# Patient Record
Sex: Male | Born: 1995 | Race: Black or African American | Hispanic: No | Marital: Single | Smoking: Former smoker
Health system: Southern US, Community
[De-identification: ages and names within clinical notes are randomized; demographics above are authoritative.]

---

## 2019-05-07 ENCOUNTER — Emergency Department: Payer: 59

## 2019-05-07 ENCOUNTER — Other Ambulatory Visit: Payer: Self-pay

## 2019-05-07 ENCOUNTER — Emergency Department
Admission: EM | Admit: 2019-05-07 | Discharge: 2019-05-07 | Disposition: A | Discharge status: Home / Self Care | Payer: 59 | Attending: Emergency Medicine | Admitting: Emergency Medicine

## 2019-05-07 DIAGNOSIS — R0789 Other chest pain: Secondary | ICD-10-CM | POA: Diagnosis not present

## 2019-05-07 DIAGNOSIS — G44209 Tension-type headache, unspecified, not intractable: Secondary | ICD-10-CM | POA: Insufficient documentation

## 2019-05-07 DIAGNOSIS — R519 Headache, unspecified: Secondary | ICD-10-CM | POA: Diagnosis present

## 2019-05-07 DIAGNOSIS — I4510 Unspecified right bundle-branch block: Secondary | ICD-10-CM | POA: Diagnosis not present

## 2019-05-07 DIAGNOSIS — Z87891 Personal history of nicotine dependence: Secondary | ICD-10-CM | POA: Insufficient documentation

## 2019-05-07 LAB — CBC
HCT: 48.1 % (ref 39.0–52.0)
Hemoglobin: 16.6 g/dL (ref 13.0–17.0)
MCH: 30.8 pg (ref 26.0–34.0)
MCHC: 34.5 g/dL (ref 30.0–36.0)
MCV: 89.2 fL (ref 80.0–100.0)
Platelets: 221 10*3/uL (ref 150–400)
RBC: 5.39 MIL/uL (ref 4.22–5.81)
RDW: 13.1 % (ref 11.5–15.5)
WBC: 3.5 10*3/uL — ABNORMAL LOW (ref 4.0–10.5)
nRBC: 0 % (ref 0.0–0.2)

## 2019-05-07 LAB — BASIC METABOLIC PANEL
Anion gap: 6 (ref 5–15)
BUN: 9 mg/dL (ref 6–20)
CO2: 27 mmol/L (ref 22–32)
Calcium: 9.5 mg/dL (ref 8.9–10.3)
Chloride: 105 mmol/L (ref 98–111)
Creatinine, Ser: 1.33 mg/dL — ABNORMAL HIGH (ref 0.61–1.24)
GFR calc Af Amer: >60 mL/min (ref >60)
GFR calc non Af Amer: >60 mL/min (ref >60)
Glucose, Bld: 91 mg/dL (ref 70–99)
Potassium: 3.8 mmol/L (ref 3.5–5.1)
Sodium: 138 mmol/L (ref 135–145)

## 2019-05-07 LAB — TROPONIN I (HIGH SENSITIVITY)
Troponin I (High Sensitivity): 4 ng/L (ref <18)
Troponin I (High Sensitivity): <2 ng/L (ref <18)

## 2019-05-07 LAB — FIBRIN DERIVATIVES D-DIMER (ARMC ONLY): Fibrin derivatives D-dimer (ARMC): 521.75 ng/mL (FEU) — ABNORMAL HIGH (ref 0.00–499.00)

## 2019-05-07 MED ORDER — KETOROLAC TROMETHAMINE 30 MG/ML IJ SOLN
15.0000 mg | Freq: Once | INTRAMUSCULAR | Status: AC
  Administered 2019-05-07: 15 mg via INTRAVENOUS
  Filled 2019-05-07: qty 1

## 2019-05-07 MED ORDER — IOHEXOL 350 MG/ML SOLN
75.0000 mL | Freq: Once | INTRAVENOUS | Status: AC | PRN
  Administered 2019-05-07: 75 mL via INTRAVENOUS

## 2019-05-07 NOTE — ED Triage Notes (Signed)
Pt states central CP and anterior HA since Thursday. Denies dizziness, blurred vision, SOB, cough.   A&O, ambulatory. No distress noted.

## 2019-05-07 NOTE — ED Provider Notes (Signed)
[facility] Emergency Department Provider Note   ____________________________________________   First MD Initiated Contact with Patient 05/07/19 1830     (approximate)  I have reviewed the triage vital signs and the nursing notes.   HISTORY  Chief Complaint Headache and Chest Pain    HPI Gary [name] is a 24 y.o. male with no significant past medical history who presents to the ED complaining of headache and chest pain.  Patient reports that he has been dealing with constant aching in the front of his head as well as the front of his chest for the past 3 days.  He describes the headache as a gradually worsening bandlike sensation wrapping around the front of his head and both sides.  He denies any fevers, vision changes, numbness, weakness, or neck stiffness.  The chest pain is described as a central throbbing that has been associated with some mild shortness of breath and seems to be worse when he takes a deep breath.  He has not had any fevers or cough and denies any pain or swelling in his legs.  He has not taken anything for the symptoms at home.        History reviewed. No pertinent past medical history.  There are no problems to display for this patient.   History reviewed. No pertinent surgical history.  Prior to Admission medications   Not on File    Allergies Patient has no known allergies.  History reviewed. No pertinent family history.  Social History Social History   Tobacco Use  . Smoking status: Former Smoker  Substance Use Topics  . Alcohol use: Not Currently  . Drug use: Not on file    Review of Systems  Constitutional: No fever/chills Eyes: No visual changes. ENT: No sore throat. Cardiovascular: Positive for chest pain. Respiratory: Denies shortness of breath. Gastrointestinal: No abdominal pain.  No nausea, no vomiting.  No diarrhea.  No constipation. Genitourinary: Negative for dysuria. Musculoskeletal: Negative  for back pain. Skin: Negative for rash. Neurological: Positive for headaches, negative for focal weakness or numbness.  ____________________________________________   PHYSICAL EXAM:  VITAL SIGNS: ED Triage Vitals  Enc Vitals Group     BP 05/07/19 1425 114/81     Pulse Rate 05/07/19 1425 81     Resp 05/07/19 1425 16     Temp 05/07/19 1425 98.6 F (37 C)     Temp Source 05/07/19 1425 Oral     SpO2 05/07/19 1425 99 %     Weight 05/07/19 1426 180 lb (81.6 kg)     Height 05/07/19 1426 5\' 9"  (1.753 m)     Head Circumference --      Peak Flow --      Pain Score 05/07/19 1426 7     Pain Loc --      Pain Edu? --      Excl. in [place]? --     Constitutional: Alert and oriented. Eyes: Conjunctivae are normal.  Pupils equal round and reactive to light bilaterally. Head: Atraumatic. Nose: No congestion/rhinnorhea. Mouth/Throat: Mucous membranes are moist. Neck: Normal ROM Cardiovascular: Normal rate, regular rhythm. Grossly normal heart sounds. Respiratory: Normal respiratory effort.  No retractions. Lungs CTAB.  No chest wall tenderness to palpation. Gastrointestinal: Soft and nontender. No distention. Genitourinary: deferred Musculoskeletal: No lower extremity tenderness nor edema. Neurologic:  Normal speech and language. No gross focal neurologic deficits are appreciated. Skin:  Skin is warm, dry and intact. No rash noted. Psychiatric: Mood and affect  are normal. Speech and behavior are normal.  ____________________________________________   LABS (all labs ordered are listed, but only abnormal results are displayed)  Labs Reviewed  BASIC METABOLIC PANEL - Abnormal; Notable for the following components:      Result Value   Creatinine, Ser 1.33 (*)    All other components within normal limits  CBC - Abnormal; Notable for the following components:   WBC 3.5 (*)    All other components within normal limits  FIBRIN DERIVATIVES D-DIMER ([facility] ONLY) - Abnormal; Notable for the  following components:   Fibrin derivatives D-dimer ([facility]) 521.75 (*)    All other components within normal limits  TROPONIN I (HIGH SENSITIVITY)  TROPONIN I (HIGH SENSITIVITY)   ____________________________________________  EKG  ED ECG REPORT I, Chesley Noon, the attending physician, personally viewed and interpreted this ECG.   Date: 05/07/2019  EKG Time: 14:28  Rate: 78  Rhythm: normal sinus rhythm  Axis: Normal  Intervals:right bundle branch block  ST&T Change: None   PROCEDURES  Procedure(s) performed (including Critical Care):  Procedures   ____________________________________________   INITIAL IMPRESSION / ASSESSMENT AND PLAN / ED COURSE       24 year old male presents to the ED complaining of constant chest pain and headache over the past 3 days.  Headache is described as bandlike across his frontal scalp and onto both sides, most consistent with tension headache.  Given gradual onset, I doubt SAH and he has no signs of meningismus.  He has no focal neurologic deficits on exam.  His chest pain also appears atypical and I doubt ACS given EKG has no ischemic changes with 2 sets negative troponin.  He does have a right bundle branch block with no prior for comparison, given this we assessed for PE with D-dimer.  D-dimer elevated however CTA negative for PE or other acute process.  Patient does endorse being under a lot of stress lately and I suspect this is contributing to his tension headache and chest pain.  He is appropriate for discharge home and I have counseled him to talk with his PCP regarding right bundle branch block as well as further treatment for anxiety.  Patient agrees with plan.      ____________________________________________   FINAL CLINICAL IMPRESSION(S) / ED DIAGNOSES  Final diagnoses:  Atypical chest pain  Tension headache  Right bundle branch block     ED Discharge Orders    None       Note:  This document was prepared using  Dragon voice recognition software and may include unintentional dictation errors.   Chesley Noon, MD 05/08/19 [phone]

## 2021-05-09 ENCOUNTER — Ambulatory Visit
Admission: EM | Admit: 2021-05-09 | Discharge: 2021-05-09 | Disposition: A | Discharge status: Home / Self Care | Payer: Medicaid Other | Attending: Family Medicine | Admitting: Family Medicine

## 2021-05-09 DIAGNOSIS — J069 Acute upper respiratory infection, unspecified: Secondary | ICD-10-CM

## 2021-05-09 DIAGNOSIS — J029 Acute pharyngitis, unspecified: Secondary | ICD-10-CM | POA: Insufficient documentation

## 2021-05-09 LAB — POCT RAPID STREP A (OFFICE): Rapid Strep A Screen: NEGATIVE

## 2021-05-09 MED ORDER — LEVOCETIRIZINE DIHYDROCHLORIDE 5 MG PO TABS: 5.0000 mg | ORAL_TABLET | Freq: Every evening | ORAL | 0 refills | Status: AC

## 2021-05-09 MED ORDER — ALBUTEROL SULFATE HFA 108 (90 BASE) MCG/ACT IN AERS
2.0000 | INHALATION_SPRAY | Freq: Once | RESPIRATORY_TRACT | Status: AC
  Administered 2021-05-09: 2 via RESPIRATORY_TRACT

## 2021-05-09 MED ORDER — ALBUTEROL SULFATE HFA 108 (90 BASE) MCG/ACT IN AERS: 2.0000 | INHALATION_SPRAY | Freq: Four times a day (QID) | RESPIRATORY_TRACT | 0 refills | Status: AC | PRN

## 2021-05-09 MED ORDER — PREDNISONE 20 MG PO TABS: 20.0000 mg | ORAL_TABLET | Freq: Every day | ORAL | 0 refills | Status: AC

## 2021-05-09 NOTE — ED Provider Notes (Signed)
?[facility] ? ? ? ?CSN: [id number] ?Arrival date & time: 05/09/21  1207 ? ? ?  ? ?History   ?Chief Complaint ?Chief Complaint  ?Patient presents with  ? Allergic Reaction  ? ? ?HPI ?Gary [name] is a 26 y.o. male.  ? ?HPI ? ?Patient presents today with URI symptoms consisting of itchy watery eyes, itchy throat, sneezing, headache, generalized body aches x3 days.  Patient reports taking some over-the-counter allergy medicines without relief.  Patient has a history of seasonal allergies and allergies.  ?He endorses wheezing occasionally.  He has albuterol however his albuterol inhaler is out of date. No fever. No known sick contacts.  ? History reviewed. No pertinent past medical history. ? ?There are no problems to display for this patient. ? ? ?History reviewed. No pertinent surgical history. ? ? ? ? ?Home Medications   ? ?Prior to Admission medications   ?Medication Sig Start Date End Date Taking? Authorizing Provider  ?albuterol (VENTOLIN HFA) 108 (90 Base) MCG/ACT inhaler Inhale 2 puffs into the lungs every 6 (six) hours as needed for wheezing or shortness of breath. 05/09/21  Yes Bing Neighbors, FNP  ?levocetirizine (XYZAL) 5 MG tablet Take 1 tablet (5 mg total) by mouth every evening. 05/09/21  Yes Bing Neighbors, FNP  ?predniSONE (DELTASONE) 20 MG tablet Take 1 tablet (20 mg total) by mouth daily with breakfast for 5 days. 05/09/21 05/14/21 Yes Bing Neighbors, FNP  ? ? ?Family History ?History reviewed. No pertinent family history. ? ?Social History ?Social History  ? ?Tobacco Use  ? Smoking status: Former  ?Vaping Use  ? Vaping Use: Never used  ?Substance Use Topics  ? Alcohol use: Not Currently  ? Drug use: Never  ? ? ? ?Allergies   ?Patient has no known allergies. ? ? ?Review of Systems ?Review of Systems ?Pertinent negatives listed in HPI  ?Physical Exam ?Triage Vital Signs ?ED Triage Vitals  ?Enc Vitals Group  ?   BP 05/09/21 1221 110/68  ?   Pulse Rate 05/09/21 1221 81  ?   Resp  05/09/21 1221 16  ?   Temp --   ?   Temp Source 05/09/21 1221 Temporal  ?   SpO2 05/09/21 1221 98 %  ?   Weight --   ?   Height --   ?   Head Circumference --   ?   Peak Flow --   ?   Pain Score 05/09/21 1220 7  ?   Pain Loc --   ?   Pain Edu? --   ?   Excl. in [place]? --   ? ?No data found. ? ?Updated Vital Signs ?BP 110/68 (BP Location: Left Arm)   Pulse 81   Resp 16   SpO2 98%  ? ?Visual Acuity ?Right Eye Distance:   ?Left Eye Distance:   ?Bilateral Distance:   ? ?Right Eye Near:   ?Left Eye Near:    ?Bilateral Near:    ? ?Physical Exam ?Constitutional:   ?   Appearance: Normal appearance.  ?HENT:  ?   Head: Normocephalic and atraumatic.  ?   Right Ear: Tympanic membrane, ear canal and external ear normal.  ?   Left Ear: Tympanic membrane, ear canal and external ear normal.  ?   Nose: Congestion and rhinorrhea present.  ?   Mouth/Throat:  ?   Pharynx: Posterior oropharyngeal erythema present. No oropharyngeal exudate.  ?Eyes:  ?   Extraocular Movements: Extraocular movements intact.  ?  Pupils: Pupils are equal, round, and reactive to light.  ?Cardiovascular:  ?   Rate and Rhythm: Normal rate and regular rhythm.  ?Pulmonary:  ?   Effort: Pulmonary effort is normal.  ?   Breath sounds: Wheezing present.  ?Skin: ?   General: Skin is warm and dry.  ?   Capillary Refill: Capillary refill takes less than 2 seconds.  ?Neurological:  ?   General: No focal deficit present.  ?   Mental Status: He is alert and oriented to person, place, and time.  ?Psychiatric:     ?   Mood and Affect: Mood normal.     ?   Behavior: Behavior normal.     ?   Thought Content: Thought content normal.  ? ? ? ?UC Treatments / Results  ?Labs ?(all labs ordered are listed, but only abnormal results are displayed) ?Labs Reviewed  ?CULTURE, GROUP A STREP [facility])  ?POCT RAPID STREP A (OFFICE)  ? ? ?EKG ? ? ?Radiology ?No results found. ? ?Procedures ?Procedures (including critical care time) ? ?Medications Ordered in UC ?Medications  ?albuterol  (VENTOLIN HFA) 108 (90 Base) MCG/ACT inhaler 2 puff (2 puffs Inhalation Given 05/09/21 1301)  ? ? ?Initial Impression / Assessment and Plan / UC Course  ?I have reviewed the triage vital signs and the nursing notes. ? ?Pertinent labs & imaging results that were available during my care of the patient were reviewed by me and considered in my medical decision making (see chart for details). ? ?  ?Acute URI and sore throat ?Rapid strep is negative. ?Treatment per discharge medication orders. ?RTC PRN ?Final Clinical Impressions(s) / UC Diagnoses  ? ?Final diagnoses:  ?URI, acute  ?Sore throat  ? ?Discharge Instructions   ?None ?  ? ?ED Prescriptions   ? ? Medication Sig Dispense Auth. Provider  ? predniSONE (DELTASONE) 20 MG tablet Take 1 tablet (20 mg total) by mouth daily with breakfast for 5 days. 5 tablet Bing Neighbors, FNP  ? levocetirizine (XYZAL) 5 MG tablet Take 1 tablet (5 mg total) by mouth every evening. 30 tablet Bing Neighbors, FNP  ? albuterol (VENTOLIN HFA) 108 (90 Base) MCG/ACT inhaler Inhale 2 puffs into the lungs every 6 (six) hours as needed for wheezing or shortness of breath. 1 each Bing Neighbors, FNP  ? ?  ? ?PDMP not reviewed this encounter. ?  ?Bing Neighbors, FNP ?05/09/21 1435 ? ?

## 2021-05-09 NOTE — ED Triage Notes (Signed)
Patient presents to Urgent Care with complaints of itchy watery eyes, itchy throat, sneezing, headache, and body aches since Tuesday. Pt states symptoms worsened Weds. Treating symptoms with OTC allergy meds. He states he has hx of seasonal allergies, concerned he is exp an allergic reaction.  ? ?Denies SOB, chest pain, hives.  ?

## 2021-05-12 LAB — CULTURE, GROUP A STREP (THRC)

## 2021-06-18 IMAGING — CT CT ANGIO CHEST
2 of 6 series · 19 of 46 positions shown · IV contrast (APPLIED)
Comparison: Chest x-ray 05/07/2019

CLINICAL DATA: Shortness of breath

EXAM:
CT ANGIOGRAPHY CHEST WITH CONTRAST
TECHNIQUE: Multidetector CT imaging of the chest was performed using the
standard protocol during bolus administration of intravenous
contrast. Multiplanar CT image reconstructions and MIPs were
obtained to evaluate the vascular anatomy.
CONTRAST:  75mL OMNIPAQUE IOHEXOL 350 MG/ML SOLN

[Series 8: thins · axial · 0.59mm/px · z∈[+14,+271]mm · 16 of 283 slices shown]
[im 13/283  lung]
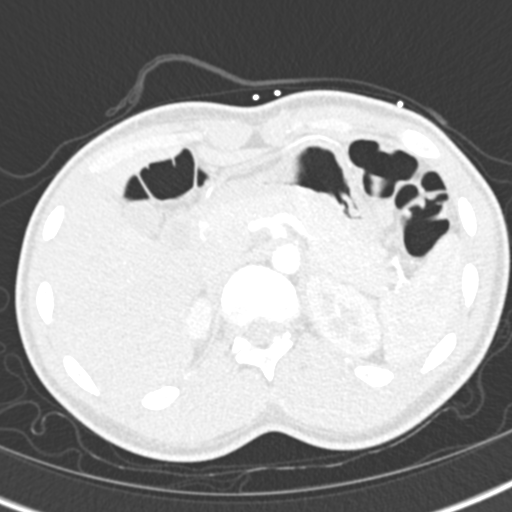
[im 37/283  soft-tissue]
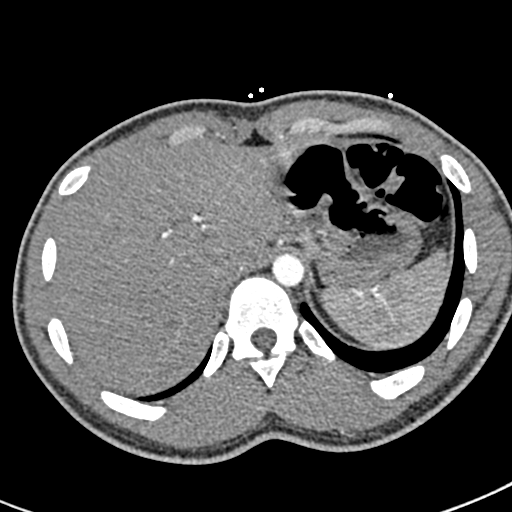
[im 50/283  lung]
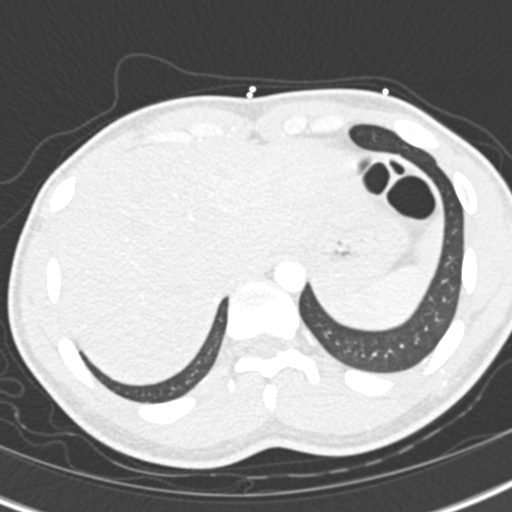
[im 62/283  soft-tissue]
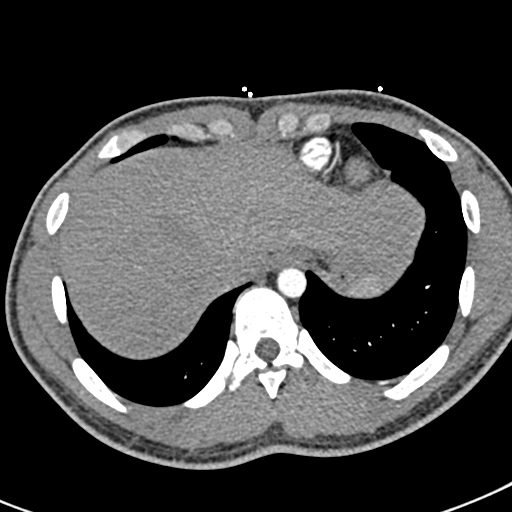
[im 86/283  lung]
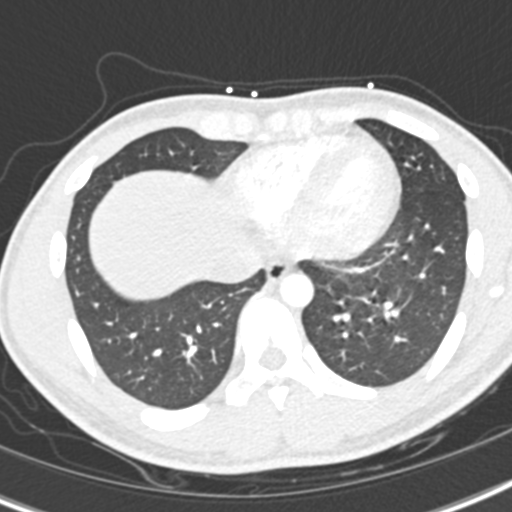
[im 99/283  soft-tissue]
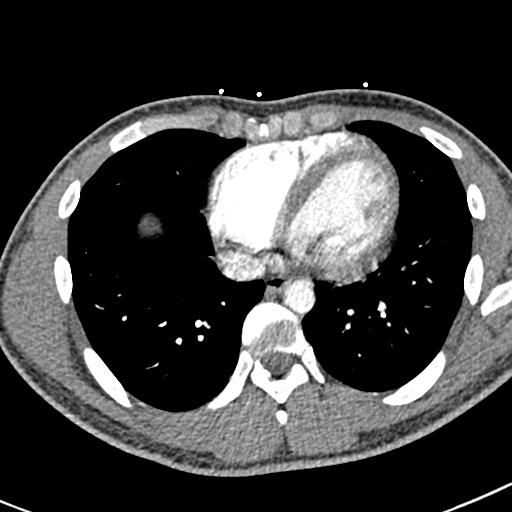
[im 111/283  lung]
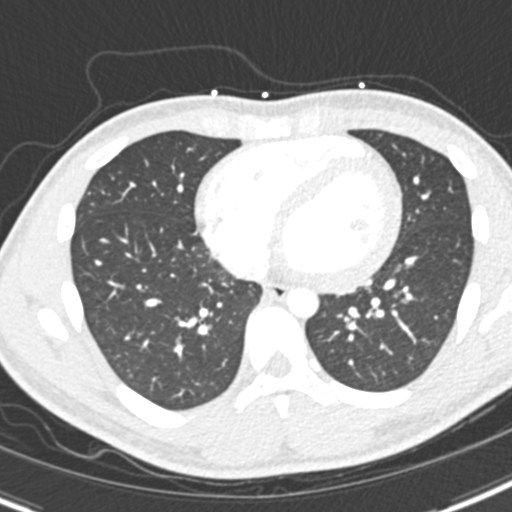
[im 135/283  soft-tissue]
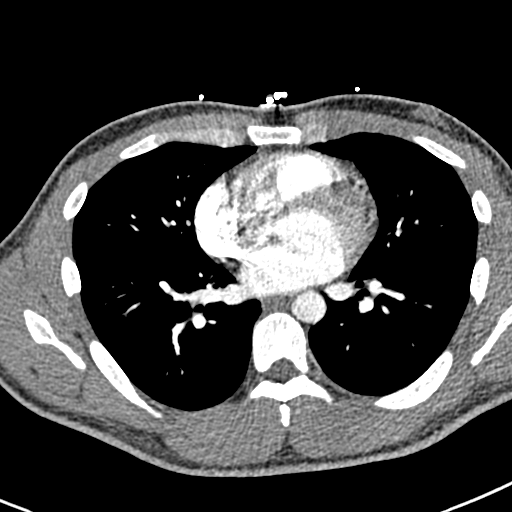
[im 148/283  lung]
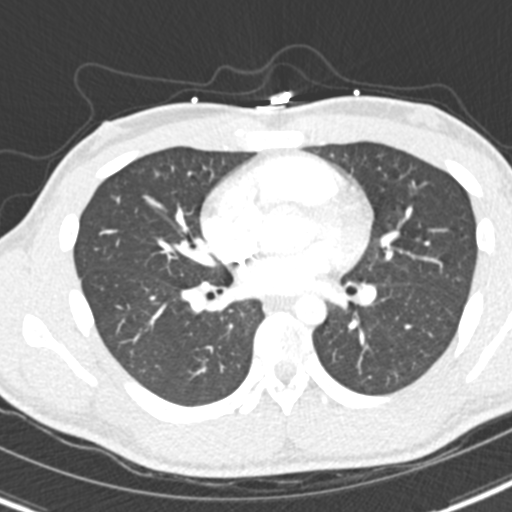
[im 172/283  soft-tissue]
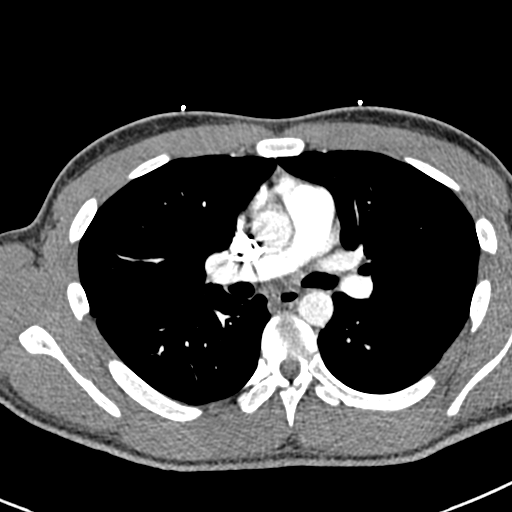
[im 184/283  lung]
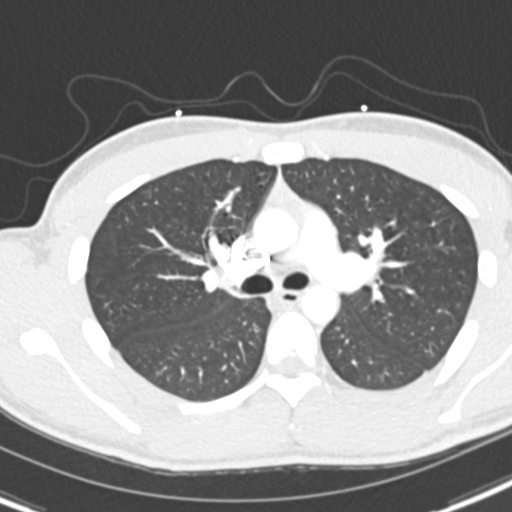
[im 197/283  soft-tissue]
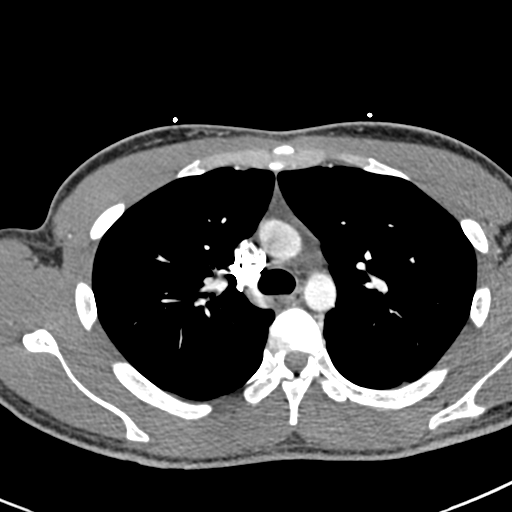
[im 221/283  lung]
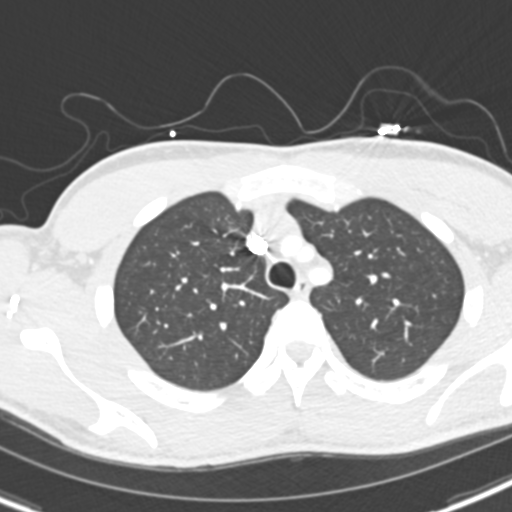
[im 233/283  soft-tissue]
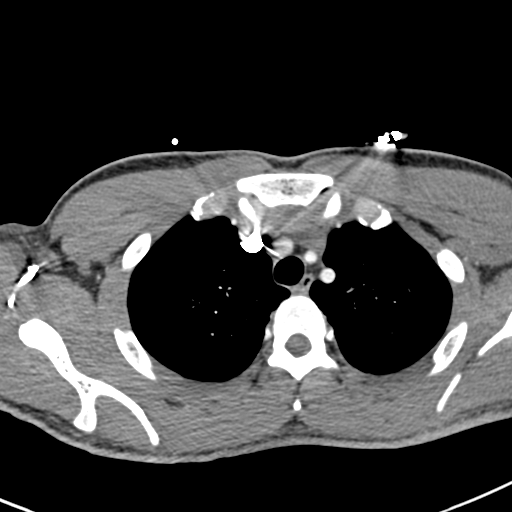
[im 246/283  lung]
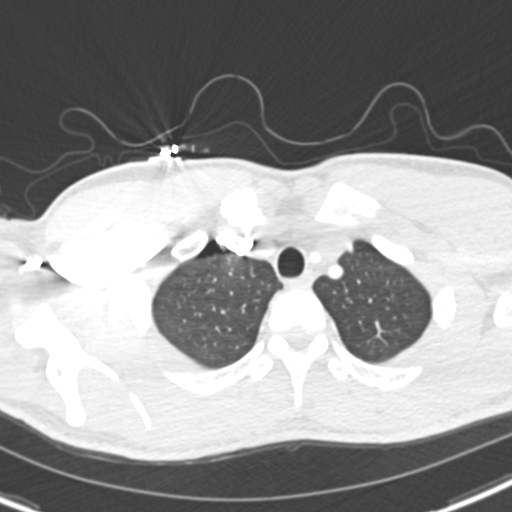
[im 270/283  soft-tissue]
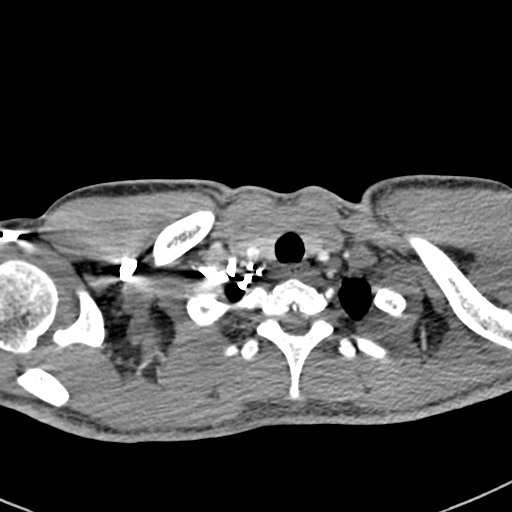

[Series 10: coronal mpr · coronal · 0.55mm/px · 3 of 73 slices shown]
[im 19/73  soft-tissue]
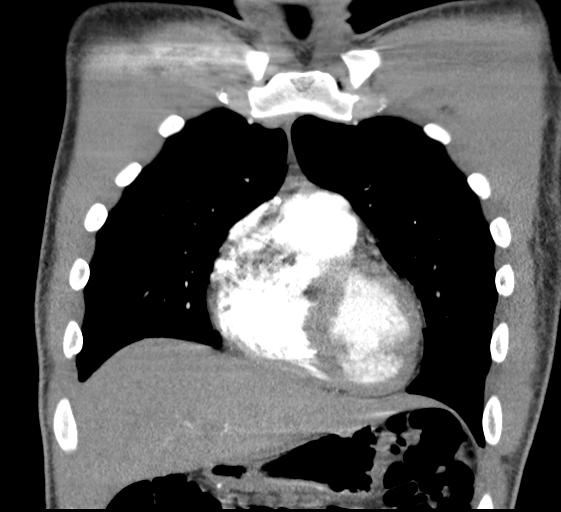
[im 37/73  soft-tissue]
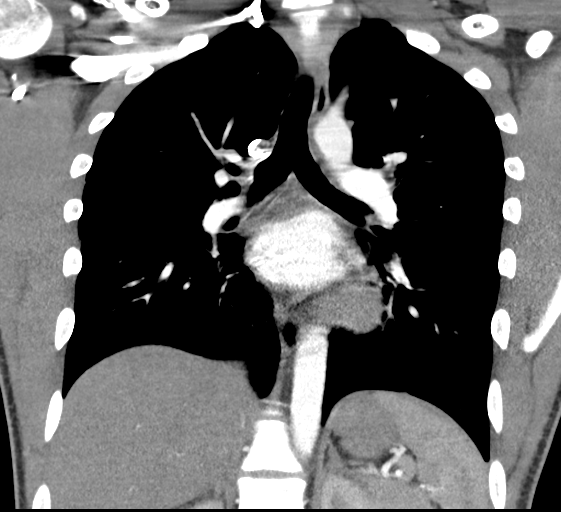
[im 55/73  soft-tissue]
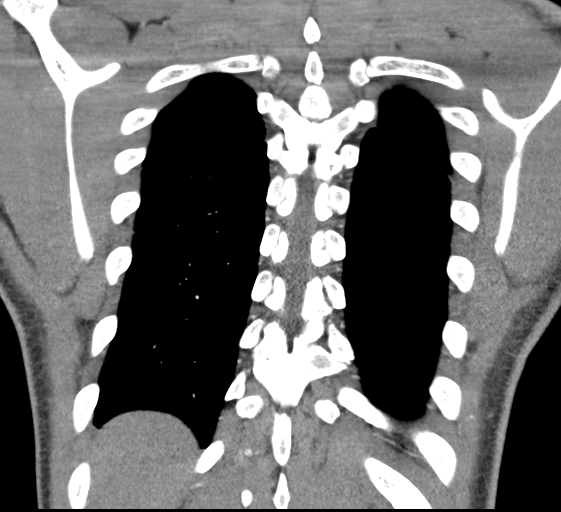

[19 of 46 positions shown; findings below may reference images not displayed]

FINDINGS: Cardiovascular: Satisfactory opacification of the pulmonary arteries
to the segmental level. No evidence of pulmonary embolism. Normal
heart size. No pericardial effusion. Nonaneurysmal aorta. No
dissection is seen.

Mediastinum/Nodes: No enlarged mediastinal, hilar, or axillary lymph
nodes. Thyroid gland, trachea, and esophagus demonstrate no
significant findings.

Lungs/Pleura: Lungs are clear. No pleural effusion or pneumothorax.

Upper Abdomen: No acute abnormality.

Musculoskeletal: Small amount of gynecomastia. No acute or
suspicious osseous abnormality.

Review of the MIP images confirms the above findings.
IMPRESSION: Negative for acute pulmonary embolus.  Clear lung fields.

## 2021-12-12 ENCOUNTER — Ambulatory Visit
Admission: EM | Admit: 2021-12-12 | Discharge: 2021-12-12 | Disposition: A | Discharge status: Home / Self Care | Payer: Medicaid Other | Attending: Emergency Medicine | Admitting: Emergency Medicine

## 2021-12-12 DIAGNOSIS — Z1152 Encounter for screening for COVID-19: Secondary | ICD-10-CM | POA: Insufficient documentation

## 2021-12-12 DIAGNOSIS — B349 Viral infection, unspecified: Secondary | ICD-10-CM | POA: Insufficient documentation

## 2021-12-12 DIAGNOSIS — R051 Acute cough: Secondary | ICD-10-CM | POA: Diagnosis not present

## 2021-12-12 DIAGNOSIS — R059 Cough, unspecified: Secondary | ICD-10-CM | POA: Insufficient documentation

## 2021-12-12 NOTE — ED Triage Notes (Signed)
Patient to Urgent Care with complaints of headaches, dry cough, and generalized body aches x1 day. Possible fever- reports waking up sweating.   Has been taking dayquil and otc allergy medication for symptoms.

## 2021-12-12 NOTE — ED Provider Notes (Signed)
Gary [name]    CSN: [id number] Arrival date & time: 12/12/21  1540      History   Chief Complaint Chief Complaint  Patient presents with   Headache   Cough    HPI Gary [name] is a 26 y.o. male.  Patient presents with headache, body aches, cough since yesterday.  No fever, sore throat, shortness of breath, vomiting, diarrhea, or other symptoms.  No treatments at home.  No pertinent medical history.   The history is provided by the patient and medical records.    History reviewed. No pertinent past medical history.  Patient Active Problem List   Diagnosis Date Noted   Cough 12/12/2021    History reviewed. No pertinent surgical history.     Home Medications    Prior to Admission medications   Medication Sig Start Date End Date Taking? Authorizing Provider  albuterol (VENTOLIN HFA) 108 (90 Base) MCG/ACT inhaler Inhale 2 puffs into the lungs every 6 (six) hours as needed for wheezing or shortness of breath. 05/09/21   Bing Neighbors, FNP  levocetirizine (XYZAL) 5 MG tablet Take 1 tablet (5 mg total) by mouth every evening. 05/09/21   Bing Neighbors, FNP    Family History History reviewed. No pertinent family history.  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   Review of Systems Review of Systems  Constitutional:  Negative for chills and fever.  HENT:  Negative for ear pain and sore throat.   Respiratory:  Positive for cough. Negative for shortness of breath.   Cardiovascular:  Negative for chest pain and palpitations.  Gastrointestinal:  Negative for diarrhea and vomiting.  Skin:  Negative for rash.  Neurological:  Positive for headaches.  All other systems reviewed and are negative.    Physical Exam Triage Vital Signs ED Triage Vitals  Enc  Vitals Group     BP      Pulse      Resp      Temp      Temp src      SpO2      Weight      Height      Head Circumference      Peak Flow      Pain Score      Pain Loc      Pain Edu?      Excl. in [place]?    No data found.  Updated Vital Signs BP 130/79   Pulse 89   Temp 98.4 F (36.9 C)   Resp 18   Ht 5\' 8"  (1.727 m)   Wt 170 lb (77.1 kg)   SpO2 96%   BMI 25.85 kg/m   Visual Acuity Right Eye Distance:   Left Eye Distance:   Bilateral Distance:    Right Eye Near:   Left Eye Near:    Bilateral Near:     Physical Exam Vitals and nursing note reviewed.  Constitutional:      General: He is not in acute distress.    Appearance: Normal appearance. He is well-developed. He is not ill-appearing.  HENT:     Right Ear: Tympanic membrane normal.     Left Ear: Tympanic membrane normal.     Nose: Nose normal.  Mouth/Throat:     Mouth: Mucous membranes are moist.     Pharynx: Oropharynx is clear.  Cardiovascular:     Rate and Rhythm: Normal rate and regular rhythm.     Heart sounds: Normal heart sounds.  Pulmonary:     Effort: Pulmonary effort is normal. No respiratory distress.     Breath sounds: Normal breath sounds.  Musculoskeletal:     Cervical back: Neck supple.  Skin:    General: Skin is warm and dry.  Neurological:     Mental Status: He is alert.  Psychiatric:        Mood and Affect: Mood normal.        Behavior: Behavior normal.      UC Treatments / Results  Labs (all labs ordered are listed, but only abnormal results are displayed) Labs Reviewed  SARS CORONAVIRUS 2 (TAT 6-24 HRS)    EKG   Radiology No results found.  Procedures Procedures (including critical care time)  Medications Ordered in UC Medications - No data to display  Initial Impression / Assessment and Plan / UC Course  I have reviewed the triage vital signs and the nursing notes.  Pertinent labs & imaging results that were available during my care of the patient were  reviewed by me and considered in my medical decision making (see chart for details).    Viral illness, cough.  COVID pending.  Discussed symptomatic treatment including Tylenol or ibuprofen, rest, hydration.  Instructed patient to follow up with PCP if symptoms are not improving.  He agrees to plan of care.   Final Clinical Impressions(s) / UC Diagnoses   Final diagnoses:  Acute cough  Viral illness     Discharge Instructions      Your COVID test is pending.  Take Tylenol or ibuprofen as needed for fever or discomfort.  Rest and keep yourself hydrated.    Follow-up with your primary care provider if your symptoms are not improving.         ED Prescriptions   None    PDMP not reviewed this encounter.   Mickie Bail, NP 12/12/21 [phone]

## 2021-12-12 NOTE — Discharge Instructions (Signed)
Your COVID test is pending.    Take Tylenol or ibuprofen as needed for fever or discomfort.  Rest and keep yourself hydrated.    Follow-up with your primary care provider if your symptoms are not improving.     

## 2021-12-13 LAB — SARS CORONAVIRUS 2 (TAT 6-24 HRS): SARS Coronavirus 2: NEGATIVE

## 2024-01-30 ENCOUNTER — Emergency Department
Admission: EM | Admit: 2024-01-30 | Discharge: 2024-01-30 | Disposition: A | Discharge status: Home / Self Care | Attending: Emergency Medicine | Admitting: Emergency Medicine

## 2024-01-30 DIAGNOSIS — F419 Anxiety disorder, unspecified: Secondary | ICD-10-CM | POA: Insufficient documentation

## 2024-01-30 LAB — LIPASE, BLOOD: Lipase: 23 U/L (ref 11–51)

## 2024-01-30 LAB — CBC
HCT: 46.1 % (ref 39.0–52.0)
Hemoglobin: 16 g/dL (ref 13.0–17.0)
MCH: 31.3 pg (ref 26.0–34.0)
MCHC: 34.7 g/dL (ref 30.0–36.0)
MCV: 90.2 fL (ref 80.0–100.0)
Platelets: 221 K/uL (ref 150–400)
RBC: 5.11 MIL/uL (ref 4.22–5.81)
RDW: 13.2 % (ref 11.5–15.5)
WBC: 8.6 K/uL (ref 4.0–10.5)
nRBC: 0 % (ref 0.0–0.2)

## 2024-01-30 LAB — COMPREHENSIVE METABOLIC PANEL WITH GFR
ALT: 12 U/L (ref 0–44)
AST: 27 U/L (ref 15–41)
Albumin: 5 g/dL (ref 3.5–5.0)
Alkaline Phosphatase: 82 U/L (ref 38–126)
Anion gap: 15 (ref 5–15)
BUN: 9 mg/dL (ref 6–20)
CO2: 22 mmol/L (ref 22–32)
Calcium: 10.3 mg/dL (ref 8.9–10.3)
Chloride: 105 mmol/L (ref 98–111)
Creatinine, Ser: 1.11 mg/dL (ref 0.61–1.24)
GFR, Estimated: >60 mL/min
Glucose, Bld: 104 mg/dL — ABNORMAL HIGH (ref 70–99)
Potassium: 3.8 mmol/L (ref 3.5–5.1)
Sodium: 142 mmol/L (ref 135–145)
Total Bilirubin: 0.6 mg/dL (ref 0.0–1.2)
Total Protein: 7.8 g/dL (ref 6.5–8.1)

## 2024-01-30 MED ORDER — HYDROXYZINE HCL 10 MG PO TABS
10.0000 mg | ORAL_TABLET | Freq: Once | ORAL | Status: AC
  Administered 2024-01-30: 10 mg via ORAL
  Filled 2024-01-30: qty 1

## 2024-01-30 MED ORDER — HYDROXYZINE HCL 10 MG PO TABS: 10.0000 mg | ORAL_TABLET | Freq: Three times a day (TID) | ORAL | 0 refills | Status: AC | PRN

## 2024-01-30 MED ORDER — ONDANSETRON 4 MG PO TBDP: 4.0000 mg | ORAL_TABLET | Freq: Three times a day (TID) | ORAL | 0 refills | Status: AC | PRN

## 2024-01-30 MED ORDER — ONDANSETRON 4 MG PO TBDP
4.0000 mg | ORAL_TABLET | Freq: Once | ORAL | Status: DC
  Filled 2024-01-30: qty 1

## 2024-01-30 NOTE — Discharge Instructions (Addendum)
 Your evaluated in the ED for post anxiety attack with vomiting.  Your lab work and physical exam findings revealed no acute life-threatening injury or illness.  Please follow-up with your primary care provider to further discuss antianxiety medication management.

## 2024-01-30 NOTE — ED Triage Notes (Signed)
 Pt presents to the ED via POV from home with anxiety, N/V and abdominal pain. Pt reports that he did drink last night, but does not normally get this way after drinking. Pt A&Ox4.

## 2024-01-30 NOTE — ED Provider Notes (Signed)
 "   [facility] Emergency Department Provider Note     Event Date/Time   First MD Initiated Contact with Patient 01/30/24 1646     (approximate)   History   Anxiety   HPI  Gary Gary [name] is Gary [name] 29 y.o. male with no significant past medical history presents to the ED for Gary [name] possible anxiety attack.  Patient reports unknown trigger.  He reports out of nowhere he just began to feel really anxious and began to have abdominal pain with 4 episodes of vomiting.  He also notes that he did consume multiple alcoholic beverages last night and does not know if this contributes to his feeling today.  On my evaluation patient is asymptomatic.  He no longer has any abdominal pain, he does not feel nauseous.  He denies any chest pain or shortness of breath.     Physical Exam   Triage Vital Signs: ED Triage Vitals  Encounter Vitals Group     BP 01/30/24 1457 129/81     Girls Systolic BP Percentile --      Girls Diastolic BP Percentile --      Boys Systolic BP Percentile --      Boys Diastolic BP Percentile --      Pulse Rate 01/30/24 1457 84     Resp 01/30/24 1457 18     Temp 01/30/24 1457 98.4 F (36.9 C)     Temp Source 01/30/24 1457 Oral     SpO2 01/30/24 1457 100 %     Weight 01/30/24 1458 140 lb (63.5 kg)     Height 01/30/24 1458 5' 9 (1.753 m)     Head Circumference --      Peak Flow --      Pain Score 01/30/24 1457 7     Pain Loc --      Pain Education --      Exclude from Growth Chart --     Most recent vital signs: Vitals:   01/30/24 1457  BP: 129/81  Pulse: 84  Resp: 18  Temp: 98.4 F (36.9 C)  SpO2: 100%    General Awake, no distress.  Well-appearing HEENT NCAT.  CV:  Good peripheral perfusion.  RRR RESP:  Normal effort.  LCTAB ABD:  No distention.  Soft, nontender  ED Results / Procedures / Treatments   Labs (all labs ordered are listed, but only abnormal results are displayed) Labs Reviewed  COMPREHENSIVE METABOLIC PANEL WITH GFR -  Abnormal; Notable for the following components:      Result Value   Glucose, Bld 104 (*)    All other components within normal limits  LIPASE, BLOOD  CBC  URINALYSIS, ROUTINE W REFLEX MICROSCOPIC   No results found.  PROCEDURES:  Critical Care performed: No  Procedures   MEDICATIONS ORDERED IN ED: Medications  ondansetron  (ZOFRAN -ODT) disintegrating tablet 4 mg (4 mg Oral Patient Refused/Not Given 01/30/24 1803)  hydrOXYzine  (ATARAX ) tablet 10 mg (10 mg Oral Given 01/30/24 1802)     IMPRESSION / MDM / ASSESSMENT AND PLAN / ED COURSE  I reviewed the triage vital signs and the nursing notes.                               29 y.o. male presents to the emergency department for evaluation and treatment of abdominal pain after stated anxiety attack. See HPI for further details.   Differential diagnosis includes, but is not limited  to EtOH use, anxiety, electrolyte abnormality, gastroenteritis, alcohol poisoning  Patient's presentation is most consistent with acute complicated illness / injury requiring diagnostic workup.  Patient is alert and oriented.  He is hemodynamic stable.  On initial assessment patient is well-appearing and in no acute distress.  Physical exam findings are stated above.  Normal abdominal exam.  No indication for further workup with imaging.  CBC and CMP obtained in triage is normal.  Lipase is normal.  We discussed antianxiety medication options including hydroxyzine .  We will give this Gary [name] try.  I will refer him to [facility] for further management discussion for possible anxiety.  He does report history of anxiety but never having anxiety attack like this.  I do believe he is in stable condition for discharge home and outpatient management.  ED return precaution discussed.   FINAL CLINICAL IMPRESSION(S) / ED DIAGNOSES   Final diagnoses:  Anxiety   Rx / DC Orders   ED Discharge Orders          Ordered    hydrOXYzine  (ATARAX ) 10 MG tablet  3 times daily PRN         01/30/24 1731    ondansetron  (ZOFRAN -ODT) 4 MG disintegrating tablet  Every 8 hours PRN        01/30/24 1731             Note:  This document was prepared using Dragon voice recognition software and may include unintentional dictation errors.    Gary Gary [name], Gary Rekowski A, PA-C 01/30/24 Gary Jacolyn Pae, MD 01/30/24 2009  "
